# Patient Record
Sex: Male | Born: 1975 | Race: White | Hispanic: No | Marital: Married | Smoking: Current some day smoker
Health system: Southern US, Community
[De-identification: ages and names within clinical notes are randomized; demographics above are authoritative.]

## PROBLEM LIST (undated history)

## (undated) DIAGNOSIS — J189 Pneumonia, unspecified organism: Secondary | ICD-10-CM

## (undated) HISTORY — PX: BACK SURGERY: SHX140

---

## 2002-12-04 ENCOUNTER — Emergency Department (HOSPITAL_COMMUNITY): Admission: EM | Admit: 2002-12-04 | Discharge: 2002-12-05 | Payer: Self-pay | Admitting: *Deleted

## 2002-12-05 ENCOUNTER — Encounter: Payer: Self-pay | Admitting: *Deleted

## 2010-02-14 ENCOUNTER — Emergency Department (HOSPITAL_BASED_OUTPATIENT_CLINIC_OR_DEPARTMENT_OTHER): Admission: EM | Admit: 2010-02-14 | Discharge: 2010-02-14 | Payer: Self-pay | Admitting: Emergency Medicine

## 2010-07-01 ENCOUNTER — Ambulatory Visit: Payer: Self-pay | Admitting: Interventional Radiology

## 2010-07-14 ENCOUNTER — Encounter: Admission: RE | Admit: 2010-07-14 | Discharge: 2010-10-12 | Payer: Self-pay | Admitting: Orthopedic Surgery

## 2010-08-17 ENCOUNTER — Ambulatory Visit (HOSPITAL_COMMUNITY): Admission: RE | Admit: 2010-08-17 | Discharge: 2010-08-17 | Payer: Self-pay | Admitting: Orthopedic Surgery

## 2010-10-09 ENCOUNTER — Emergency Department (HOSPITAL_BASED_OUTPATIENT_CLINIC_OR_DEPARTMENT_OTHER)
Admission: EM | Admit: 2010-10-09 | Discharge: 2010-10-09 | Payer: Self-pay | Source: Home / Self Care | Admitting: Emergency Medicine

## 2010-10-09 ENCOUNTER — Ambulatory Visit: Payer: Self-pay | Admitting: Diagnostic Radiology

## 2010-10-29 ENCOUNTER — Emergency Department (HOSPITAL_BASED_OUTPATIENT_CLINIC_OR_DEPARTMENT_OTHER): Admission: EM | Admit: 2010-10-29 | Discharge: 2010-07-01 | Payer: Self-pay | Admitting: Emergency Medicine

## 2010-11-18 ENCOUNTER — Observation Stay (HOSPITAL_COMMUNITY)
Admission: RE | Admit: 2010-11-18 | Discharge: 2010-11-19 | Payer: Self-pay | Source: Home / Self Care | Attending: Orthopedic Surgery | Admitting: Orthopedic Surgery

## 2011-01-05 ENCOUNTER — Ambulatory Visit: Payer: BC Managed Care – PPO | Attending: Orthopedic Surgery | Admitting: Physical Therapy

## 2011-01-05 DIAGNOSIS — M542 Cervicalgia: Secondary | ICD-10-CM | POA: Insufficient documentation

## 2011-01-05 DIAGNOSIS — R5381 Other malaise: Secondary | ICD-10-CM | POA: Insufficient documentation

## 2011-01-05 DIAGNOSIS — IMO0001 Reserved for inherently not codable concepts without codable children: Secondary | ICD-10-CM | POA: Insufficient documentation

## 2011-01-05 DIAGNOSIS — R293 Abnormal posture: Secondary | ICD-10-CM | POA: Insufficient documentation

## 2011-01-11 ENCOUNTER — Ambulatory Visit: Payer: BC Managed Care – PPO | Admitting: Physical Therapy

## 2011-01-13 ENCOUNTER — Ambulatory Visit: Payer: BC Managed Care – PPO | Admitting: Physical Therapy

## 2011-01-15 ENCOUNTER — Ambulatory Visit: Payer: BC Managed Care – PPO | Admitting: Physical Therapy

## 2011-01-19 ENCOUNTER — Ambulatory Visit: Payer: BC Managed Care – PPO | Admitting: Physical Therapy

## 2011-01-21 ENCOUNTER — Ambulatory Visit: Payer: BC Managed Care – PPO | Attending: Orthopedic Surgery | Admitting: Physical Therapy

## 2011-01-21 DIAGNOSIS — R293 Abnormal posture: Secondary | ICD-10-CM | POA: Insufficient documentation

## 2011-01-21 DIAGNOSIS — IMO0001 Reserved for inherently not codable concepts without codable children: Secondary | ICD-10-CM | POA: Insufficient documentation

## 2011-01-21 DIAGNOSIS — M542 Cervicalgia: Secondary | ICD-10-CM | POA: Insufficient documentation

## 2011-01-21 DIAGNOSIS — R5381 Other malaise: Secondary | ICD-10-CM | POA: Insufficient documentation

## 2011-01-26 ENCOUNTER — Ambulatory Visit: Payer: BC Managed Care – PPO | Admitting: Physical Therapy

## 2011-01-28 ENCOUNTER — Ambulatory Visit: Payer: BC Managed Care – PPO | Admitting: Physical Therapy

## 2011-02-01 LAB — CBC
Hemoglobin: 14.8 g/dL (ref 13.0–17.0)
RBC: 4.95 MIL/uL (ref 4.22–5.81)

## 2011-02-01 LAB — SURGICAL PCR SCREEN: MRSA, PCR: NEGATIVE

## 2011-02-04 ENCOUNTER — Ambulatory Visit: Payer: BC Managed Care – PPO | Admitting: Physical Therapy

## 2011-06-25 ENCOUNTER — Encounter: Payer: Self-pay | Admitting: *Deleted

## 2011-06-25 ENCOUNTER — Emergency Department (INDEPENDENT_AMBULATORY_CARE_PROVIDER_SITE_OTHER): Payer: BC Managed Care – PPO

## 2011-06-25 ENCOUNTER — Emergency Department (HOSPITAL_BASED_OUTPATIENT_CLINIC_OR_DEPARTMENT_OTHER): Payer: BC Managed Care – PPO

## 2011-06-25 ENCOUNTER — Emergency Department (HOSPITAL_BASED_OUTPATIENT_CLINIC_OR_DEPARTMENT_OTHER)
Admission: EM | Admit: 2011-06-25 | Discharge: 2011-06-25 | Disposition: A | Discharge status: Home / Self Care | Payer: BC Managed Care – PPO | Attending: Emergency Medicine | Admitting: Emergency Medicine

## 2011-06-25 DIAGNOSIS — R059 Cough, unspecified: Secondary | ICD-10-CM | POA: Insufficient documentation

## 2011-06-25 DIAGNOSIS — J189 Pneumonia, unspecified organism: Secondary | ICD-10-CM | POA: Insufficient documentation

## 2011-06-25 DIAGNOSIS — R05 Cough: Secondary | ICD-10-CM | POA: Insufficient documentation

## 2011-06-25 MED ORDER — AZITHROMYCIN 250 MG PO TABS: 250.0000 mg | ORAL_TABLET | Freq: Every day | ORAL | Status: AC

## 2011-06-25 MED ORDER — LIDOCAINE HCL (PF) 1 % IJ SOLN
INTRAMUSCULAR | Status: AC
  Administered 2011-06-25: 1.7 mL
  Filled 2011-06-25: qty 5

## 2011-06-25 MED ORDER — DEXTROSE 5 % IV SOLN
INTRAVENOUS | Status: AC
  Filled 2011-06-25: qty 1

## 2011-06-25 MED ORDER — AZITHROMYCIN 250 MG PO TABS
500.0000 mg | ORAL_TABLET | Freq: Once | ORAL | Status: AC
  Administered 2011-06-25: 500 mg via ORAL
  Filled 2011-06-25: qty 2

## 2011-06-25 MED ORDER — CEFTRIAXONE SODIUM 1 G IJ SOLR
1.0000 g | Freq: Once | INTRAMUSCULAR | Status: AC
  Administered 2011-06-25: 1 g via INTRAMUSCULAR
  Filled 2011-06-25: qty 1

## 2011-06-25 NOTE — ED Notes (Signed)
Cough, fever, aching all over, and headache x 1 week.  

## 2011-06-25 NOTE — ED Provider Notes (Signed)
History     CSN: [id number] Arrival date & time: 06/25/2011  1:52 PM  Chief Complaint  Patient presents with  . URI   Patient is a 35 y.o. male presenting with URI. The history is provided by the patient.  URI The primary symptoms include fever and cough. The current episode started more than 1 week ago. This is a new problem. The problem has been gradually worsening.  Symptoms associated with the illness include chills, congestion and rhinorrhea. The following treatments were addressed: Acetaminophen was not tried. A decongestant was not tried. Aspirin was not tried. NSAIDs were not tried.    History reviewed. No pertinent past medical history.  Past Surgical History  Procedure Date  . Back surgery     No family history on file.  History  Substance Use Topics  . Smoking status: Not on file  . Smokeless tobacco: Current User  . Alcohol Use: Yes     occasionally      Review of Systems  Constitutional: Positive for fever and chills.  HENT: Positive for congestion and rhinorrhea.   Respiratory: Positive for cough.   All other systems reviewed and are negative.    Physical Exam  BP 119/74  Pulse 88  Temp(Src) 99.8 F (37.7 C) (Oral)  Resp 22  SpO2 97%  Physical Exam  Constitutional: He is oriented to person, place, and time. He appears well-developed and well-nourished.  HENT:  Head: Normocephalic and atraumatic.  Eyes: Conjunctivae and EOM are normal. Pupils are equal, round, and reactive to light.  Neck: Normal range of motion. Neck supple.  Cardiovascular: Normal rate.   Pulmonary/Chest: Effort normal and breath sounds normal.  Abdominal: Soft.  Musculoskeletal: Normal range of motion.  Neurological: He is alert and oriented to person, place, and time. He has normal reflexes.  Skin: Skin is warm and dry.  Psychiatric: He has a normal mood and affect.    ED Course  Procedures  MDM Chest xray shows right middle lobe pneumonia,  Pt counseled on need for  recheck chest xray in 4 weeks.  Results for orders placed during the hospital encounter of 11/18/10  CBC      Component Value Range   WBC 6.5  4.0 - 10.5 (K/uL)   RBC 4.95  4.22 - 5.81 (MIL/uL)   Hemoglobin 14.8  13.0 - 17.0 (g/dL)   HCT 78.2  95.6 - 21.3 (%)   MCV 89.1  78.0 - 100.0 (fL)   MCH 29.9  26.0 - 34.0 (pg)   MCHC 33.6  30.0 - 36.0 (g/dL)   RDW 08.6  57.8 - 46.9 (%)   Platelets 303  150 - 400 (K/uL)  SURGICAL PCR SCREEN      Component Value Range   MRSA, PCR NEGATIVE  NEGATIVE    Staphylococcus aureus   (*) NEGATIVE    Value: POSITIVE            The Xpert SA Assay (FDA     approved for NASAL specimens     only), is one component of     a comprehensive surveillance     program.  It is not intended     to diagnose infection nor to     guide or monitor treatment.   Dg Chest 2 View  06/25/2011  *RADIOLOGY REPORT*  Clinical Data: Cough, fever  CHEST - 2 VIEW  Comparison: 10/09/2010  Findings: Unchanged cardiac silhouette and mediastinal contours. Interval development of a heterogeneous air space opacity within the  right middle lobe worrisome for pneumonia.  No pleural effusion or pneumothorax.  Interval ACDF of the lower cervical spine, incompletely evaluated.  IMPRESSION: Right middle lobe pneumonia.  A follow-up chest radiograph in 4-6 weeks after treatment is recommended to ensure resolution.  Original Report Authenticated By: Waynard Reeds, M.D.       Langston Masker, [place] 06/25/11 [phone]

## 2011-06-28 LAB — ROCKY MTN SPOTTED FVR AB, IGG-BLOOD: RMSF IgG: 0.16 IV

## 2011-07-07 NOTE — ED Provider Notes (Signed)
History/physical exam/procedure(s) were performed by non-physician practitioner and as supervising physician I was immediately available for consultation/collaboration. I have reviewed all notes and am in agreement with care and plan.   Hilario Quarry, MD 07/07/11 [phone]

## 2013-11-12 ENCOUNTER — Encounter (HOSPITAL_BASED_OUTPATIENT_CLINIC_OR_DEPARTMENT_OTHER): Payer: Self-pay | Admitting: Emergency Medicine

## 2013-11-12 ENCOUNTER — Emergency Department (HOSPITAL_BASED_OUTPATIENT_CLINIC_OR_DEPARTMENT_OTHER)
Admission: EM | Admit: 2013-11-12 | Discharge: 2013-11-12 | Disposition: A | Discharge status: Home / Self Care | Payer: 59 | Attending: Emergency Medicine | Admitting: Emergency Medicine

## 2013-11-12 ENCOUNTER — Emergency Department (HOSPITAL_BASED_OUTPATIENT_CLINIC_OR_DEPARTMENT_OTHER): Payer: 59

## 2013-11-12 DIAGNOSIS — R630 Anorexia: Secondary | ICD-10-CM | POA: Insufficient documentation

## 2013-11-12 DIAGNOSIS — J111 Influenza due to unidentified influenza virus with other respiratory manifestations: Secondary | ICD-10-CM | POA: Insufficient documentation

## 2013-11-12 MED ORDER — AZITHROMYCIN 250 MG PO TABS: ORAL_TABLET | ORAL | Status: DC

## 2013-11-12 MED ORDER — HYDROCOD POLST-CHLORPHEN POLST 10-8 MG/5ML PO LQCR
5.0000 mL | Freq: Once | ORAL | Status: AC
  Administered 2013-11-12: 5 mL via ORAL
  Filled 2013-11-12: qty 5

## 2013-11-12 MED ORDER — ACETAMINOPHEN 500 MG PO TABS
1000.0000 mg | ORAL_TABLET | Freq: Once | ORAL | Status: AC
  Administered 2013-11-12: 1000 mg via ORAL
  Filled 2013-11-12: qty 2

## 2013-11-12 MED ORDER — HYDROCOD POLST-CHLORPHEN POLST 10-8 MG/5ML PO LQCR: 5.0000 mL | Freq: Two times a day (BID) | ORAL | Status: DC | PRN

## 2013-11-12 NOTE — ED Notes (Signed)
Flu like symptoms since Friday. Pt took theraflu last night that had fever reducer in it but has not had anything this am but delysm for cough.

## 2013-11-12 NOTE — ED Provider Notes (Addendum)
CSN: [id number]     Arrival date & time 11/12/13  0518 History   First MD Initiated Contact with Patient 11/12/13 0543     Chief Complaint  Patient presents with  . Fever   (Consider location/radiation/quality/duration/timing/severity/associated sxs/prior Treatment) HPI This is a 37 year old male with a three-day history of flulike symptoms. Specifically he has had fever, body aches, cough, malaise and decreased appetite. The cough is nonproductive. This morning his fever peaked at 103.4. He has not taken in antipyretic this morning, but did take Delsym. Yesterday he developed some nasal congestion. He has had no nausea, vomiting or diarrhea. He did not get a flu vaccine this year. He has had pneumonia in the past.  History reviewed. No pertinent past medical history. Past Surgical History  Procedure Laterality Date  . Back surgery     No family history on file. History  Substance Use Topics  . Smoking status: Never Smoker   . Smokeless tobacco: Current User  . Alcohol Use: Yes     Comment: occasionally    Review of Systems  All other systems reviewed and are negative.    Allergies  Review of patient's allergies indicates no known allergies.  Home Medications   Current Outpatient Rx  Name  Route  Sig  Dispense  Refill  . pseudoephedrine-acetaminophen (TYLENOL SINUS) 30-500 MG TABS   Oral   Take 1 tablet by mouth every 4 (four) hours as needed.          BP 123/68  Pulse 104  Temp(Src) 100.5 F (38.1 C)  Resp 20  Ht 5\' 7"  (1.702 m)  Wt 185 lb (83.915 kg)  BMI 28.97 kg/m2  SpO2 98%  Physical Exam General: Well-developed, well-nourished male in no acute distress; appearance consistent with age of record HENT: normocephalic; atraumatic; nasal congestion; pharyngeal erythema without exudate topically Eyes: pupils equal, round and reactive to light; extraocular muscles intact Neck: supple;  Heart: regular rate and rhythm;  tachycardia  Lungs: clear to  auscultation bilaterally; frequent dry cough  Abdomen: soft; nondistended; nontender; no masses or hepatosplenomegaly; bowel sounds present Extremities: No deformity; full range of motion; pulses normal Neurologic: Awake, alert and oriented; motor function intact in all extremities and symmetric; no facial droop Skin: Warm and dry Psychiatric:  flat affect    ED Course  Procedures (including critical care time)  MDM  Nursing notes and vitals signs, including pulse oximetry, reviewed.  Summary of this visit's results, reviewed by myself:  Imaging Studies: Dg Chest 2 View  11/12/2013   CLINICAL DATA:  Fever, cough, congestion and body aches.  EXAM: CHEST  2 VIEW  COMPARISON:  Chest radiograph performed 06/25/2011  FINDINGS: The lungs are well-aerated. Mild right basilar airspace opacity could reflect atelectasis or mild pneumonia. There is no evidence of pleural effusion or pneumothorax.  The heart is borderline normal in size; the mediastinal contour is within normal limits. No acute osseous abnormalities are seen. Cervical spinal fusion hardware is partially imaged.  IMPRESSION: Mild right basilar airspace opacity could reflect atelectasis or mild pneumonia.   Electronically Signed   By: Roanna Raider M.D.   On: 11/12/2013 06:42   The patient's personal illnesses likely influenza but we will treat with any ionic given suggestive x-ray findings and history of pneumonia in the past.      Hanley Seamen, MD 11/12/13 [id number]  Hanley Seamen, MD 11/12/13 [phone]

## 2013-11-12 NOTE — ED Notes (Signed)
Transported to xray 

## 2013-11-12 NOTE — ED Notes (Signed)
Returned from xray

## 2014-11-29 ENCOUNTER — Encounter: Payer: Self-pay | Admitting: Emergency Medicine

## 2014-11-29 ENCOUNTER — Emergency Department (INDEPENDENT_AMBULATORY_CARE_PROVIDER_SITE_OTHER)
Admission: EM | Admit: 2014-11-29 | Discharge: 2014-11-29 | Disposition: A | Discharge status: Home / Self Care | Payer: 59 | Source: Home / Self Care | Attending: Family Medicine | Admitting: Family Medicine

## 2014-11-29 ENCOUNTER — Emergency Department (INDEPENDENT_AMBULATORY_CARE_PROVIDER_SITE_OTHER): Payer: 59

## 2014-11-29 DIAGNOSIS — R062 Wheezing: Secondary | ICD-10-CM

## 2014-11-29 DIAGNOSIS — J209 Acute bronchitis, unspecified: Secondary | ICD-10-CM

## 2014-11-29 DIAGNOSIS — Z8701 Personal history of pneumonia (recurrent): Secondary | ICD-10-CM

## 2014-11-29 DIAGNOSIS — R05 Cough: Secondary | ICD-10-CM

## 2014-11-29 HISTORY — DX: Pneumonia, unspecified organism: J18.9

## 2014-11-29 MED ORDER — PREDNISONE 20 MG PO TABS: 20.0000 mg | ORAL_TABLET | Freq: Two times a day (BID) | ORAL | Status: DC

## 2014-11-29 MED ORDER — AZITHROMYCIN 250 MG PO TABS: ORAL_TABLET | ORAL | Status: DC

## 2014-11-29 MED ORDER — BENZONATATE 200 MG PO CAPS: 200.0000 mg | ORAL_CAPSULE | Freq: Every day | ORAL | Status: DC

## 2014-11-29 NOTE — ED Notes (Signed)
Dry cough x 1 week, hx pneumonia 4 x in 4 yrs

## 2014-11-29 NOTE — Discharge Instructions (Signed)
Take plain Mucinex (1200 mg guaifenesin) twice daily for cough and congestion.  May add Pseudoephedrine if sinus congestion develops.   Increase fluid intake, rest. May use Afrin nasal spray (or generic oxymetazoline) twice daily for about 5 days.  Also recommend using saline nasal spray several times daily and saline nasal irrigation (AYR is a common brand) Try warm salt water gargles for sore throat.  Stop all antihistamines for now, and other non-prescription cough/cold preparations.  Follow-up with family doctor if not improving about one week

## 2014-11-29 NOTE — ED Provider Notes (Signed)
CSN: [id number]     Arrival date & time 11/29/14  1906 History   First MD Initiated Contact with Patient 11/29/14 1943     Chief Complaint  Patient presents with  . Cough      HPI Comments: Patient complains of onset of chest congestion, non-productive cough, and wheezing without nasal congestion or sore throat about four days ago.  He feels tightness in his anterior chest and has shortness of breath with activity.  No fevers, chills, and sweats. He had an episode of pneumonia September 2015  The history is provided by the patient and the spouse.    Past Medical History  Diagnosis Date  . Pneumonia    Past Surgical History  Procedure Laterality Date  . Back surgery     No family history on file. History  Substance Use Topics  . Smoking status: Never Smoker   . Smokeless tobacco: Current User  . Alcohol Use: Yes     Comment: occasionally    Review of Systems + sore throat + cough No pleuritic pain but has aching in ribs bilaterally + wheezing No nasal congestion No post-nasal drainage No sinus pain/pressure No itchy/red eyes No earache No hemoptysis + SOB with activity No fever, + chills No nausea No vomiting No abdominal pain + diarrhea, resolved No urinary symptoms No skin rash + fatigue No myalgias + headache Used OTC meds without relief  Allergies  Review of patient's allergies indicates not on file.  Home Medications   Prior to Admission medications   Medication Sig Start Date End Date Taking? Authorizing Provider  ibuprofen (ADVIL,MOTRIN) 200 MG tablet Take 200 mg by mouth every 6 (six) hours as needed.   Yes Historical Provider, MD  azithromycin (ZITHROMAX Z-PAK) 250 MG tablet 2 po day one, then 1 daily x 4 days 11/29/14   Lattie Haw, MD  benzonatate (TESSALON) 200 MG capsule Take 1 capsule (200 mg total) by mouth at bedtime. Take as needed for cough 11/29/14   Lattie Haw, MD  predniSONE (DELTASONE) 20 MG tablet Take 1 tablet (20 mg total) by  mouth 2 (two) times daily. Take with food. 11/29/14   Lattie Haw, MD  pseudoephedrine-acetaminophen (TYLENOL SINUS) 30-500 MG TABS Take 1 tablet by mouth every 4 (four) hours as needed.    Historical Provider, MD   BP 150/91 mmHg  Pulse 82  Temp(Src) 98.7 F (37.1 C) (Oral)  Resp 16  Ht  (1.702 m)  Wt 180 lb (81.647 kg)  BMI 28.19 kg/m2  SpO2 96% Physical Exam Nursing notes and Vital Signs reviewed. Appearance:  Patient appears healthy, stated age, and in no acute distress Eyes:  Pupils are equal, round, and reactive to light and accomodation.  Extraocular movement is intact.  Conjunctivae are not inflamed  Ears:  Canals normal.  Tympanic membranes normal.  Nose:  Mildly congested turbinates.  No sinus tenderness.    Pharynx:  Normal Neck:  Supple.  Posterior nodes are prominent but nontender bilaterally  Lungs:  Clear to auscultation.  Breath sounds are equal.  Heart:  Regular rate and rhythm without murmurs, rubs, or gallops.  Abdomen:  Nontender without masses or hepatosplenomegaly.  Bowel sounds are present.  No CVA or flank tenderness.  Extremities:  No edema.  No calf tenderness Skin:  No rash present.   ED Course  Procedures  none    Imaging Review Dg Chest 2 View  11/29/2014   CLINICAL DATA:  One week history of  wheezing and cough  EXAM: CHEST  2 VIEW  COMPARISON:  January 25, 2014  FINDINGS: There is no edema or consolidation. The heart size and pulmonary vascularity are within normal limits. No adenopathy. There is postoperative change in the lower cervical spine.  IMPRESSION: No edema or consolidation.   Electronically Signed   By: Bretta BangWilliam  Woodruff M.D.   On: 11/29/2014 19:49     MDM   1. Acute bronchitis, unspecified organism   2. History of pneumonia    Begin Z-pack to cover atypicals, and prednisone burst.  Prescription written for Benzonatate (Tessalon) to take at bedtime for night-time cough.  Take plain Mucinex (1200 mg guaifenesin) twice daily for cough  and congestion.  May add Pseudoephedrine if sinus congestion develops.   Increase fluid intake, rest. May use Afrin nasal spray (or generic oxymetazoline) twice daily for about 5 days.  Also recommend using saline nasal spray several times daily and saline nasal irrigation (AYR is a common brand) Try warm salt water gargles for sore throat.  Stop all antihistamines for now, and other non-prescription cough/cold preparations.  Follow-up with family doctor if not improving about one week    Lattie HawStephen A Beese, MD 12/01/14 [phone]

## 2015-11-23 HISTORY — PX: OTHER SURGICAL HISTORY: SHX169

## 2016-06-11 IMAGING — CR DG CHEST 2V
2 series · 2 of 2 positions shown · non-contrast
Comparison: January 25, 2014

CLINICAL DATA: One week history of wheezing and cough

EXAM:
CHEST  2 VIEW

[view not recorded (1 of 2)]
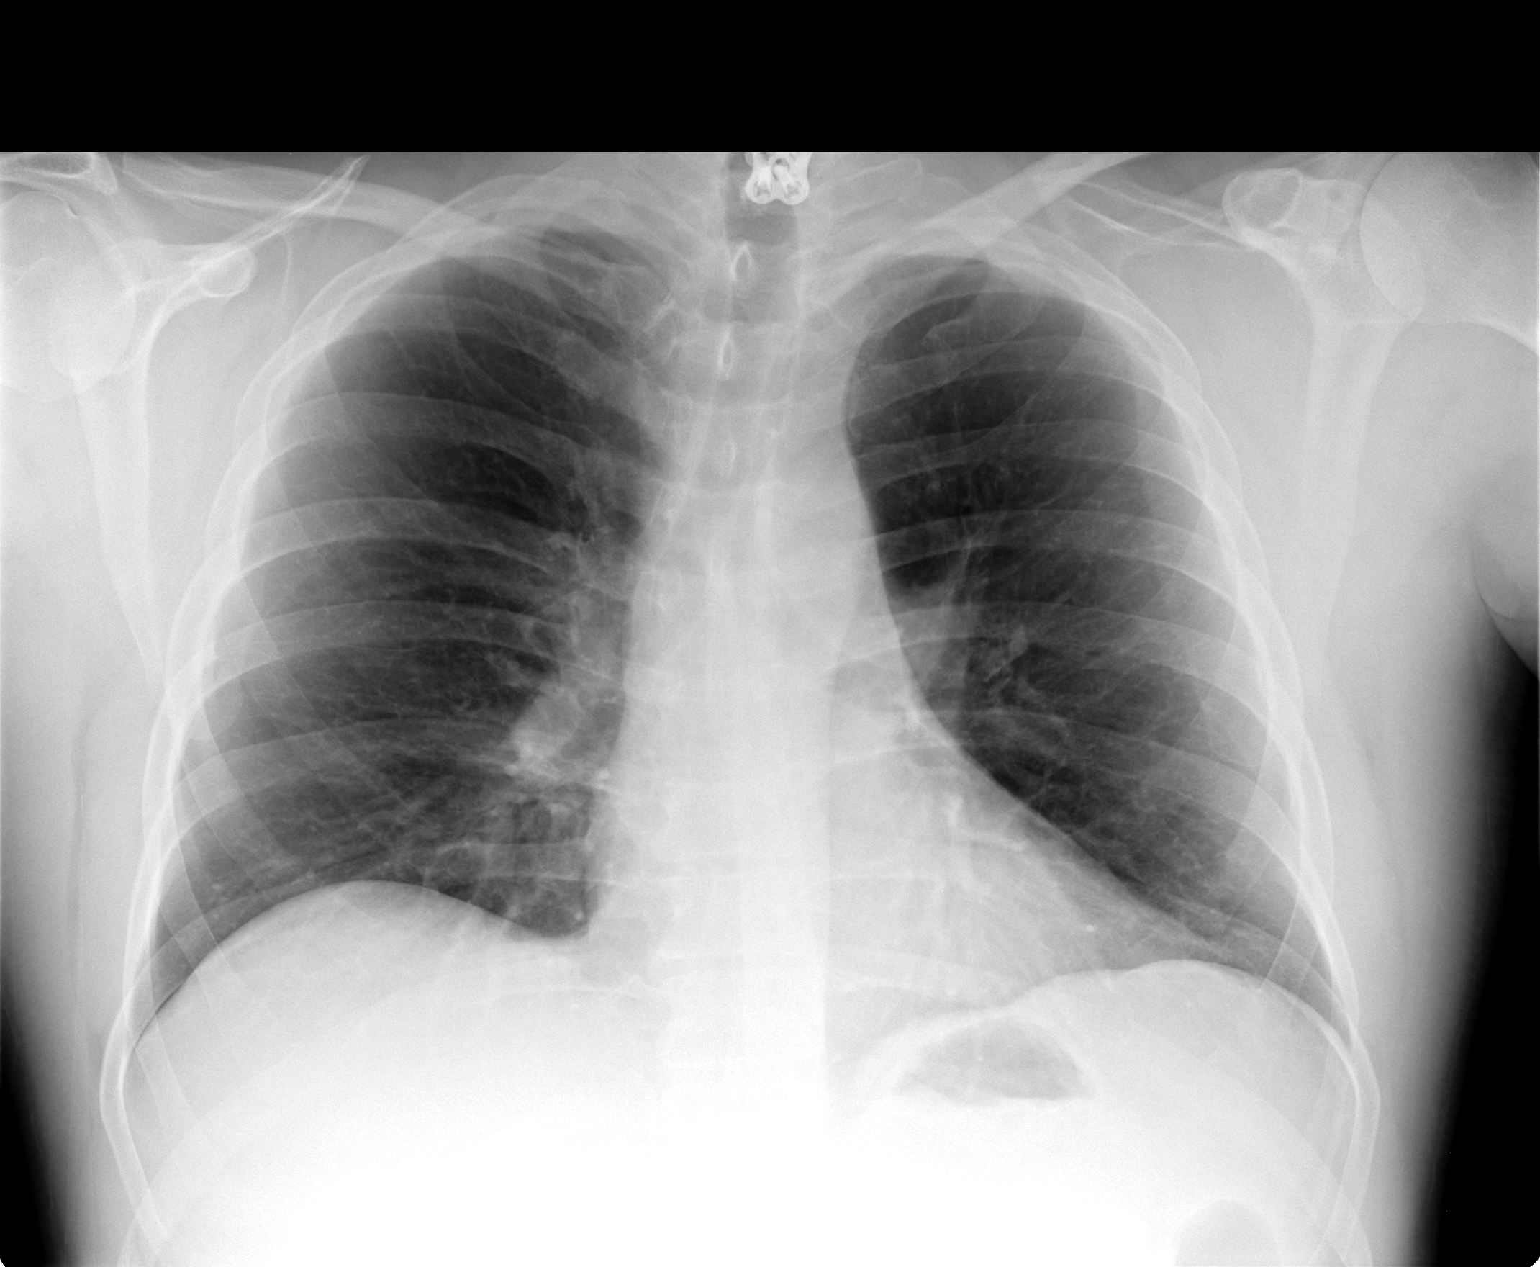

[view not recorded (2 of 2)]
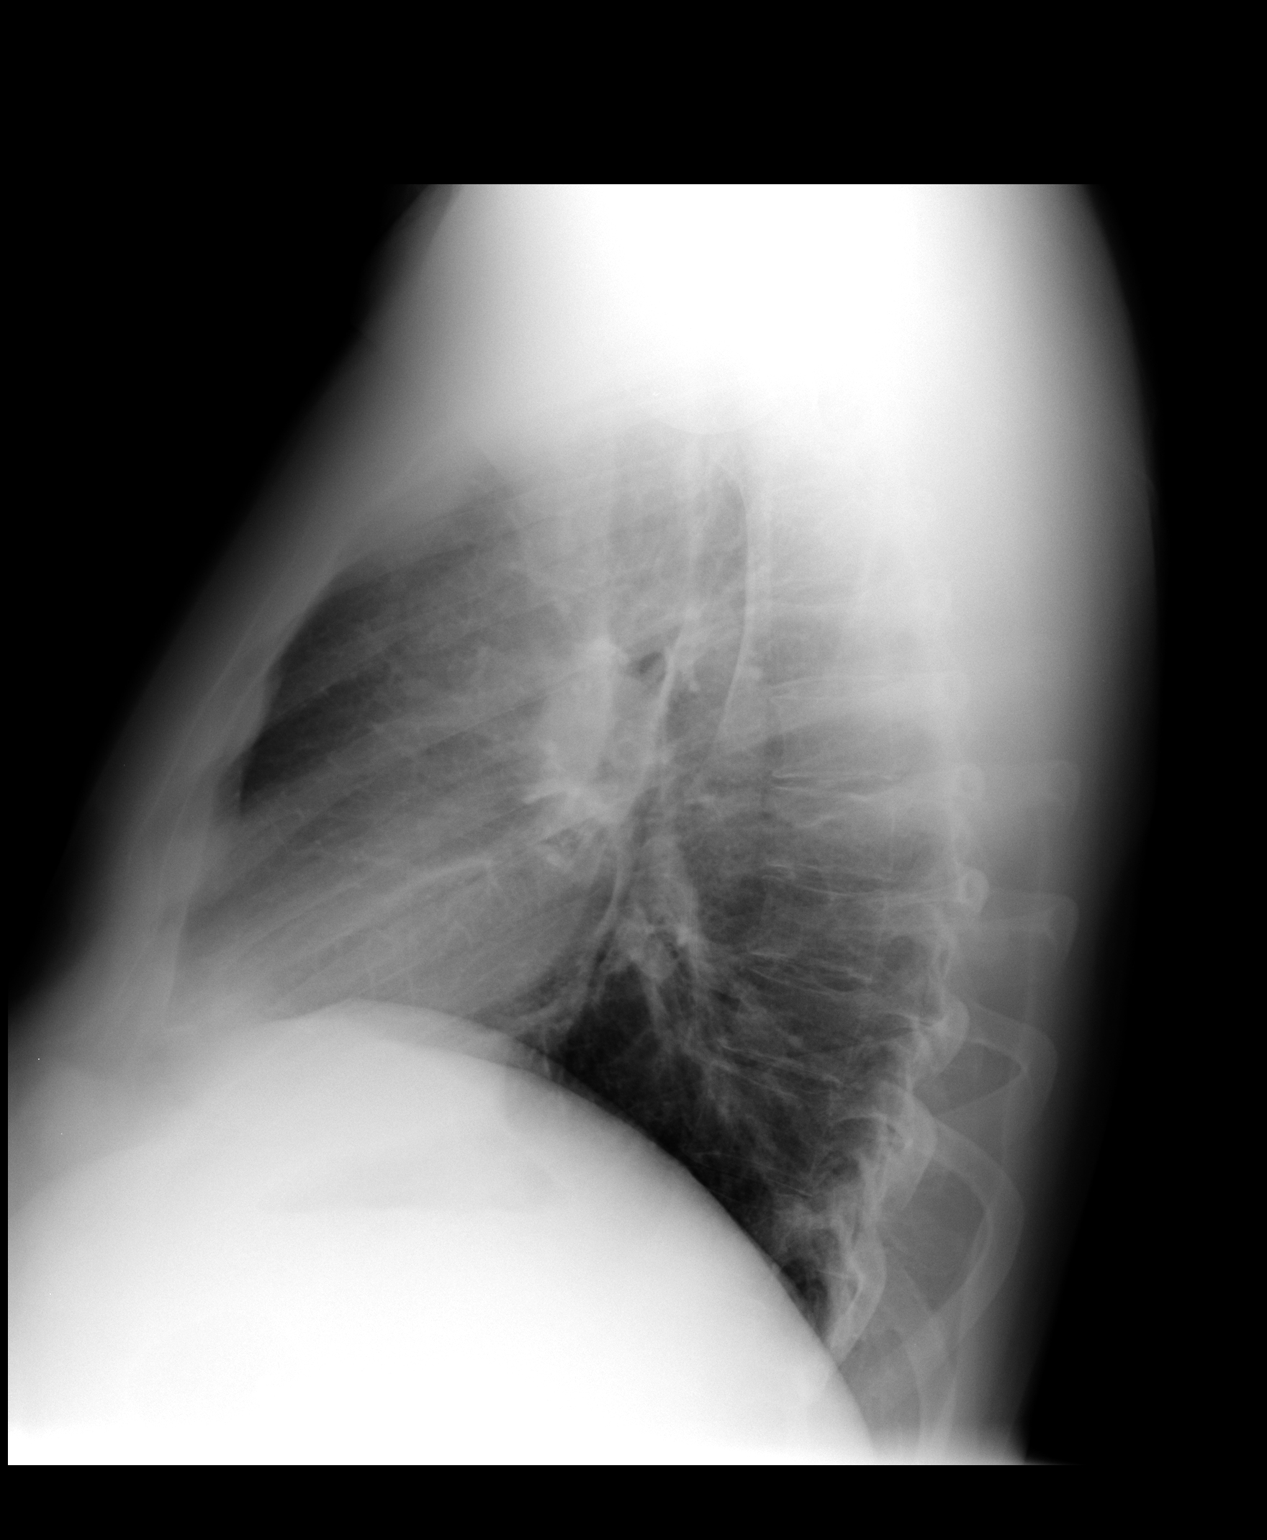

[2 of 2 positions shown; findings below may reference images not displayed]

FINDINGS: There is no edema or consolidation. The heart size and pulmonary
vascularity are within normal limits. No adenopathy. There is
postoperative change in the lower cervical spine.
IMPRESSION: No edema or consolidation.

## 2020-01-23 ENCOUNTER — Ambulatory Visit: Payer: 59 | Admitting: Neurology

## 2020-01-23 ENCOUNTER — Other Ambulatory Visit: Payer: Self-pay

## 2020-01-23 ENCOUNTER — Encounter: Payer: Self-pay | Admitting: Neurology

## 2020-01-23 VITALS — BP 136/84 | HR 59 | Temp 98.6°F | Ht 67.0 in | Wt 201.0 lb

## 2020-01-23 DIAGNOSIS — R202 Paresthesia of skin: Secondary | ICD-10-CM

## 2020-01-23 DIAGNOSIS — Z981 Arthrodesis status: Secondary | ICD-10-CM

## 2020-01-23 NOTE — Patient Instructions (Signed)
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 This procedure includes the combined use of electromyogram (EMG) and nerve conduction study (NCS). EMG is used to look for muscular disorders. NCS, which is also called electroneurogram, measures how well your nerves are controlling your muscles. The procedures are usually done together to check if your muscles and nerves are healthy. If the results of the tests are abnormal, this may indicate disease or injury, such as a neuromuscular disease or peripheral nerve damage. Tell a health care provider about:  Any allergies you have.  All medicines you are taking, including vitamins, herbs, eye drops, creams, and over-the-counter medicines.  Any problems you or family members have had with anesthetic medicines.  Any blood disorders you have.  Any surgeries you have had.  Any medical conditions you have.  If you have a pacemaker.  Whether you are pregnant or may be pregnant. What are the risks? Generally, this is a safe procedure. However, problems may occur, including:  Infection where the electrodes were inserted.  Bleeding. What happens before the procedure? Medicines Ask your health care provider about:  Changing or stopping your regular medicines. This is especially important if you are taking diabetes medicines or blood thinners.  Taking medicines such as aspirin and ibuprofen. These medicines can thin your blood. Do not take these medicines unless your health care provider tells you to take them.  Taking over-the-counter medicines, vitamins, herbs, and supplements. General instructions  Your health care provider may ask you to avoid: ? Beverages that have caffeine, such as coffee and tea. ? Any products that contain nicotine or tobacco. These products include cigarettes, e-cigarettes,  and chewing tobacco. If you need help quitting, ask your health care provider.  Do not use lotions or creams on the same day that you will be having the procedure. What happens during the procedure? For EMG   Your health care provider will ask you to stay in a position so that he or she can access the muscle that will be studied. You may be standing, sitting, or lying down.  You may be given a medicine that numbs the area (local anesthetic).  A very thin needle that has an electrode will be inserted into your muscle.  Another small electrode will be placed on your skin near the muscle.  Your health care provider will ask you to continue to remain still.  The electrodes will send a signal that tells about the electrical activity of your muscles. You may see this on a monitor or hear it in the room.  After your muscles have been studied at rest, your health care provider will ask you to contract or flex your muscles. The electrodes will send a signal that tells about the electrical activity of your muscles.  Your health care provider will remove the electrodes and the electrode needles when the procedure is finished. The procedure may vary among health care providers and hospitals. For NCS   An electrode that records your nerve activity (recording electrode) will be placed on your skin by the muscle that is being studied.  An electrode that is used as a reference (reference electrode) will be placed near the recording electrode.  A paste or gel will be applied to your skin between the recording electrode and the reference electrode.  Your nerve will be stimulated with a mild shock. Your health care provider will  measure how much time it takes for your muscle to react.  Your health care provider will remove the electrodes and the gel when the procedure is finished. The procedure may vary among health care providers and hospitals. What happens after the procedure?  It is up to you  to get the results of your procedure. Ask your health care provider, or the department that is doing the procedure, when your results will be ready.  Your health care provider may: ? Give you medicines for any pain. ? Monitor the insertion sites to make sure that bleeding stops. Summary  Electromyoneurogram is a test to check how well your muscles and nerves are working.  If the results of the tests are abnormal, this may indicate disease or injury.  This is a safe procedure. However, problems may occur, such as bleeding and infection.  Your health care provider will do two tests to complete this procedure. One checks your muscles (EMG) and another checks your nerves (NCS).  It is up to you to get the results of your procedure. Ask your health care provider, or the department that is doing the procedure, when your results will be ready. This information is not intended to replace advice given to you by your health care provider. Make sure you discuss any questions you have with your health care provider. Document Revised: 07/25/2018 Document Reviewed: 07/07/2018 Elsevier Patient Education  2020 [employer].

## 2020-01-23 NOTE — Progress Notes (Signed)
[facility]    Provider:  Dr Lucia Gaskins Requesting Provider: Venita Lick, MD Primary Care Provider:  Medicine, [facility]  CC:  Numbness and tingling in the hands  HPI:  Ernest Avery is a 44 y.o. male here as requested by Venita Lick, MD for paresthesias, numbness and tingling in the hands and feet. He reports numbness in the hands. He has had mri cervical spine and acdf. Dr Shon Baton does not think this is from his neck. Comes and goes. Both hands. Weak grip. Dropping objects. At night he wakes up and shakes the hands out, so numb like dead fish. All the fingers. Numb and tingly. And not related to neck pain or shooting pain from the neck. He works for himself, he is working a lot with his hands. He smokes some. Symptoms started worsening in the hands 5-6 months ago, can be painful when he is waking it is moderately painful. Emg/ncs in the uppers.   Leg symptoms are positional. When he is sitting for long periods of time, resolves with standing and moving, both feet but not all the time. Sitting for long periods, he feels it in his back starts int he lower back with tingling. Also cramps in the calfs. Symptoms started in the low back and feet maybe around the same time. Will check a few sensory and motors in one leg right leg.No significant low back stiffness.   Reviewed notes, labs and imaging from outside physicians, which showed:  Reviewed CT of the head report from 2004: CLINICAL DATA:  HEADACHE WITH BLURRY VISION. HEAD CT WITHOUT CONTRAST COMPARISON NONE.  CONTIGUOUS AXIAL IMAGES OF THE BRAIN WERE OBTAINED AFTER ROUTINE UNINFUSED CT SCANNING OF THE HEAD. THERE IS NO EVIDENCE OF INTRACRANIAL HEMORRHAGE, BRAIN EDEMA, OR MASS EFFECT.  THE VENTRICLES ARE NORMAL.  NO EXTRA-AXIAL ABNORMALITIES ARE IDENTIFIED.  BONE WINDOWS SHOW NO SIGNIFICANT ABNORMALITIES. IMPRESSION NEGATIVE NON-CONTRAST HEAD CT.  Cmp 12/06/2019 nml, cbc unremarkable 05/2017   Reviewed dg cervical spine 2011 and agree with following report:  Findings: ACDF of C6-7.  Anterior plate and interbody metal spacer are in good position although bony detail is limited due to overlying shoulders.  The remainder of the cervical spine appears normal.   IMPRESSION: ACDF C6-7.  Review of Systems: Patient complains of symptoms per HPI as well as the following symptoms: chronic hand numbness from surgery, low back pain. Pertinent negatives and positives per HPI. All others negative.   Social History   Socioeconomic History  . Marital status: Married    Spouse name: Not on file  . Number of children: 3  . Years of education: HS & trade school  . Highest education level: Not on file  Occupational History  . Not on file  Tobacco Use  . Smoking status: Current Some Day Smoker    Types: Cigars  . Smokeless tobacco: Current User    Types: Chew  . Tobacco comment: cigar once in awhile, smoked cigarettes for about a year in his 55s.   Substance and Sexual Activity  . Alcohol use: Yes    Comment: occasionally  . Drug use: No  . Sexual activity: Not on file  Other Topics Concern  . Not on file  Social History Narrative   Lives at home with wife & kids   Right handed   Caffeine: soda/tea. 1-2 sodas a day   Social Determinants of Health   Financial Resource Strain:   . Difficulty of Paying Living Expenses: Not on  file  Food Insecurity:   . Worried About Charity fundraiser in the Last Year: Not on file  . Ran Out of Food in the Last Year: Not on file  Transportation Needs:   . Lack of Transportation (Medical): Not on file  . Lack of Transportation (Non-Medical): Not on file  Physical Activity:   . Days of Exercise per Week: Not on file  . Minutes of Exercise per Session: Not on file  Stress:   . Feeling of Stress : Not on file  Social Connections:   . Frequency of Communication with Friends and Family: Not on file  . Frequency of Social Gatherings with  Friends and Family: Not on file  . Attends Religious Services: Not on file  . Active Member of Clubs or Organizations: Not on file  . Attends Archivist Meetings: Not on file  . Marital Status: Not on file  Intimate Partner Violence:   . Fear of Current or Ex-Partner: Not on file  . Emotionally Abused: Not on file  . Physically Abused: Not on file  . Sexually Abused: Not on file    Family History  Problem Relation Age of Onset  . Prostate cancer Maternal Grandfather   . Heart disease Other        father's side   . Neuropathy Neg Hx     Past Medical History:  Diagnosis Date  . Pneumonia     Patient Active Problem List   Diagnosis Date Noted  . S/P cervical spinal fusion 01/25/2020    Past Surgical History:  Procedure Laterality Date  . BACK SURGERY  2010   C6-C7 spinal fusion   . knee surgery Left 2017    Current Outpatient Medications  Medication Sig Dispense Refill  . hydrOXYzine (ATARAX/VISTARIL) 10 MG tablet Take 10 mg by mouth 3 (three) times daily as needed.    Marland Kitchen ibuprofen (ADVIL,MOTRIN) 200 MG tablet Take 600 mg by mouth in the morning and at bedtime.     . pantoprazole (PROTONIX) 40 MG tablet Take 40 mg by mouth daily.     No current facility-administered medications for this visit.    Allergies as of 01/23/2020  . (Not on File)    Vitals: BP 136/84 (BP Location: Right Arm, Patient Position: Sitting)   Pulse (!) 59   Temp 98.6 F (37 C) Comment: taken at front  Ht 5\' 7"  (1.702 m)   Wt 201 lb (91.2 kg)   BMI 31.48 kg/m  Last Weight:  Wt Readings from Last 1 Encounters:  01/23/20 201 lb (91.2 kg)   Last Height:   Ht Readings from Last 1 Encounters:  01/23/20 5\' 7"  (1.702 m)     Physical exam: Exam: Gen: NAD, conversant, well nourised, obese, well groomed                     CV: RRR, no MRG. No Carotid Bruits. No peripheral edema, warm, nontender Eyes: Conjunctivae clear without exudates or hemorrhage  Neuro: Detailed  Neurologic Exam  Speech:    Speech is normal; fluent and spontaneous with normal comprehension.  Cognition:    The patient is oriented to person, place, and time;     recent and remote memory intact;     language fluent;     normal attention, concentration,     fund of knowledge Cranial Nerves:    The pupils are equal, round, and reactive to light. Attempted fundoscopy, pupils too small. Visual fields are  full to finger confrontation. Extraocular movements are intact. Trigeminal sensation is intact and the muscles of mastication are normal. The face is symmetric. The palate elevates in the midline. Hearing intact. Voice is normal. Shoulder shrug is normal. The tongue has normal motion without fasciculations.   Coordination:    Normal finger to nose  Gait:    Normal native gait  Motor Observation:    No asymmetry, no atrophy, and no involuntary movements noted. Tone:    Normal muscle tone.    Posture:    Posture is normal. normal erect    Strength: right grip weakness.  Strength is V/V in the upper and lower limbs.      Sensation: intact to LT     Reflex Exam:  DTR's:    Deep tendon reflexes in the upper and lower extremities are brisk bilaterally.   Toes:    The toes are downgoing bilaterally.   Clonus:    Clonus is absent.    Assessment/Plan:  Patient with prior acdf with worsening hand numbness and pain likely CTS. He has episodic symptoms in the legs mostly when sitting too long or with positions unlikely polyneuropathy more consistent with lumbar radiculopathy.   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 EMG/NCS.  Lower extremities: not consistent with polyneuropathy likely mild radiculopathy, conservative treatments or return to Dr. Shon Baton for further evaluation.  Orders Placed This Encounter  Procedures  . NCV with EMG(electromyography)     Cc: Venita Lick, MD,  Medicine, [facility]  Naomie Dean, MD  [facility] [address] [place], [place] [address]  Phone [phone] Fax [phone]

## 2020-01-25 ENCOUNTER — Encounter: Payer: Self-pay | Admitting: Neurology

## 2020-01-25 DIAGNOSIS — Z981 Arthrodesis status: Secondary | ICD-10-CM | POA: Insufficient documentation

## 2020-02-28 ENCOUNTER — Encounter: Payer: 59 | Admitting: Neurology

## 2020-03-09 NOTE — Progress Notes (Signed)
Full Name: Ernest Avery Gender: Male MRN #: [id number] Date of Birth: 10-May-1976    Visit Date: 03/10/2020 14:15 Age: 44 Years Examining Physician: Naomie Dean, MD  Referring Physician: Venita Lick, MD Height: 5 feet 7 inch    History: Numbness and tingling in the hands, emg/ncs bilateral upper extremities.  Summary: EMG/NCS performed on the bilateral upper extremities. Summary:   Nerve Conduction Studies were performed on the bilateral upper extremities.  The right median APB motor nerve showed prolonged distal onset latency (6.9 ms, N<4.4). The left  median APB motor nerve showed prolonged distal onset latency (6.6 ms, N<4.4). The right Median 2nd Digit orthodromic sensory nerve showed no response. The left  Median 2nd Digit orthodromic sensory nerve showed no response.   The right median/ulnar (palm) comparison nerve showed prolonged distal peak latency (Median Palm, 3.0 ms, N<2.2) and abnormal peak latency difference (Median Palm-Ulnar Palm, 1.1 ms, N<0.4) with a relative median delay.    The left median/ulnar (palm) comparison nerve showed prolonged distal peak latency (Median Palm, 2.7 ms, N<2.2) and abnormal peak latency difference (Median Palm-Ulnar Palm, 0.7 ms, N<0.4) with a relative median delay.    Conclusion: This is an abnormal study. There is electrophysiologic evidence of bilateral moderately-severe  right > left Carpal Tunnel Syndrome.  No suggestion of polyneuropathy or radiculopathy.   Naomie Dean M.D.  [facility] [address] [place], [place] [address] Tel: [phone] Fax: [phone]         [facility]    Nerve / Sites Muscle Latency Ref. Amplitude Ref. Rel Amp Segments Distance Velocity Ref. Area    ms ms mV mV %  cm m/s m/s mVms  L Median - APB     Wrist APB 6.6 ?4.4 6.1 ?4.0 100 Wrist - APB 7   27.8     Upper arm APB 9.9  7.2  118 Upper arm - Wrist 23 71 ?49 35.0     Ulnar Wrist APB 3.2  5.6  77.7 Ulnar Wrist - Upper arm     22.2     Ulnar B. Elbow APB 7.2  4.4  78.3 Ulnar B. Elbow - Ulnar Wrist    14.9     Ulnar A. Below APB 8.6  4.3  97.4 Ulnar A. Below - Ulnar B. Elbow    15.8  R Median - APB     Wrist APB 6.9 ?4.4 6.5 ?4.0 100 Wrist - APB 7   23.2     Upper arm APB 11.5  6.2  95.7 Upper arm - Wrist 23 49 ?49 21.6  L Ulnar - ADM     Wrist ADM 3.1 ?3.3 12.2 ?6.0 100 Wrist - ADM 7   36.2     B.Elbow ADM 7.0  10.9  89.5 B.Elbow - Wrist 21 54 ?49 36.6     A.Elbow ADM 8.9  9.7  89.1 A.Elbow - B.Elbow 10 52 ?49 33.6         A.Elbow - Wrist      R Ulnar - ADM     Wrist ADM 2.8 ?3.3 9.7 ?6.0 100 Wrist - ADM 7   30.9     B.Elbow ADM 6.4  7.9  81.5 B.Elbow - Wrist 21 59 ?49 27.0     A.Elbow ADM 8.2  7.7  97.1 A.Elbow - B.Elbow 10 56 ?49 26.5         A.Elbow - Wrist  Foley    Nerve / Sites Rec. Site Peak Lat Ref.  Amp Ref. Segments Distance Peak Diff Ref.    ms ms V V  cm ms ms  L Median, Ulnar - Transcarpal comparison     Median Palm Wrist 2.7 ?2.2 11 ?35 Median Palm - Wrist 8       Ulnar Palm Wrist 2.0 ?2.2 35 ?12 Ulnar Palm - Wrist 8          Median Palm - Ulnar Palm  0.7 ?0.4  R Median, Ulnar - Transcarpal comparison     Median Palm Wrist 3.0 ?2.2 12 ?35 Median Palm - Wrist 8       Ulnar Palm Wrist 1.8 ?2.2 25 ?12 Ulnar Palm - Wrist 8          Median Palm - Ulnar Palm  1.1 ?0.4  L Median - Orthodromic (Dig II, Mid palm)     Dig II Wrist NR ?3.4 NR ?10 Dig II - Wrist 13    R Median - Orthodromic (Dig II, Mid palm)     Dig II Wrist NR ?3.4 NR ?10 Dig II - Wrist 13    L Ulnar - Orthodromic, (Dig V, Mid palm)     Dig V Wrist 2.9 ?3.1 11 ?5 Dig V - Wrist 11    R Ulnar - Orthodromic, (Dig V, Mid palm)     Dig V Wrist 2.6 ?3.1 8 ?5 Dig V - Wrist 62                   F  Wave    Nerve F Lat Ref.   ms ms  L Ulnar - ADM 28.3 ?32.0  R Ulnar - ADM 27.7 ?32.0         EMG Summary Table    Spontaneous MUAP Recruitment  Muscle IA Fib PSW Fasc Other Amp Dur. Poly Pattern  L. Deltoid Normal None  None None _______ Normal Normal Normal Normal  R. Deltoid Normal None None None _______ Normal Normal Normal Normal  L. Triceps brachii Normal None None None _______ Normal Normal Normal Normal  R. Triceps brachii Normal None None None _______ Normal Normal Normal Normal  L. Pronator teres Normal None None None _______ Normal Normal Normal Normal  R. Pronator teres Normal None None None _______ Normal Normal Normal Normal  L. Opponens pollicis Normal None None None _______ Normal Normal Normal Normal  R. Opponens pollicis Normal None None None _______ Normal Normal Normal Normal  L. First dorsal interosseous Normal None None None _______ Normal Normal Normal Normal  R. First dorsal interosseous Normal None None None _______ Normal Normal Normal Normal

## 2020-03-10 ENCOUNTER — Other Ambulatory Visit: Payer: Self-pay

## 2020-03-10 ENCOUNTER — Ambulatory Visit (INDEPENDENT_AMBULATORY_CARE_PROVIDER_SITE_OTHER): Payer: 59 | Admitting: Neurology

## 2020-03-10 ENCOUNTER — Ambulatory Visit: Payer: 59 | Admitting: Neurology

## 2020-03-10 DIAGNOSIS — G5603 Carpal tunnel syndrome, bilateral upper limbs: Secondary | ICD-10-CM | POA: Diagnosis not present

## 2020-03-10 DIAGNOSIS — Z0289 Encounter for other administrative examinations: Secondary | ICD-10-CM

## 2020-03-10 DIAGNOSIS — R202 Paresthesia of skin: Secondary | ICD-10-CM | POA: Insufficient documentation

## 2020-03-10 NOTE — Procedures (Signed)
Full Name: Ernest Avery Gender: Male MRN #: [id number] Date of Birth: 09/17/1976    Visit Date: 03/10/2020 14:15 Age: 44 Years Examining Physician: Sarina Ill, MD  Referring Physician: Sarina Ill, MD Height: 5 feet 7 inch    History: Numbness and tingling in the hands, emg/ncs bilateral upper extremities.  Summary: EMG/NCS performed on the bilateral upper extremities. Summary:   Nerve Conduction Studies were performed on the bilateral upper extremities.  The right median APB motor nerve showed prolonged distal onset latency (6.9 ms, N<4.4). The left  median APB motor nerve showed prolonged distal onset latency (6.6 ms, N<4.4). The right Median 2nd Digit orthodromic sensory nerve showed no response. The left  Median 2nd Digit orthodromic sensory nerve showed no response.   The right median/ulnar (palm) comparison nerve showed prolonged distal peak latency (Median Palm, 3.0 ms, N<2.2) and abnormal peak latency difference (Median Palm-Ulnar Palm, 1.1 ms, N<0.4) with a relative median delay.    The left median/ulnar (palm) comparison nerve showed prolonged distal peak latency (Median Palm, 2.7 ms, N<2.2) and abnormal peak latency difference (Median Palm-Ulnar Palm, 0.7 ms, N<0.4) with a relative median delay.    Conclusion: This is an abnormal study. There is electrophysiologic evidence of bilateral moderately-severe  right > left Carpal Tunnel Syndrome.  No suggestion of polyneuropathy or radiculopathy.   Sarina Ill M.D.  [facility] [place], [place] [address] Tel: [phone] Fax: [phone]         [facility]    Nerve / Sites Muscle Latency Ref. Amplitude Ref. Rel Amp Segments Distance Velocity Ref. Area    ms ms mV mV %  cm m/s m/s mVms  L Median - APB     Wrist APB 6.6 ?4.4 6.1 ?4.0 100 Wrist - APB 7   27.8     Upper arm APB 9.9  7.2  118 Upper arm - Wrist 23 71 ?49 35.0     Ulnar Wrist APB 3.2  5.6  77.7 Ulnar Wrist - Upper arm     22.2     Ulnar B. Elbow APB 7.2  4.4  78.3 Ulnar B. Elbow - Ulnar Wrist    14.9     Ulnar A. Below APB 8.6  4.3  97.4 Ulnar A. Below - Ulnar B. Elbow    15.8  R Median - APB     Wrist APB 6.9 ?4.4 6.5 ?4.0 100 Wrist - APB 7   23.2     Upper arm APB 11.5  6.2  95.7 Upper arm - Wrist 23 49 ?49 21.6  L Ulnar - ADM     Wrist ADM 3.1 ?3.3 12.2 ?6.0 100 Wrist - ADM 7   36.2     B.Elbow ADM 7.0  10.9  89.5 B.Elbow - Wrist 21 54 ?49 36.6     A.Elbow ADM 8.9  9.7  89.1 A.Elbow - B.Elbow 10 52 ?49 33.6         A.Elbow - Wrist      R Ulnar - ADM     Wrist ADM 2.8 ?3.3 9.7 ?6.0 100 Wrist - ADM 7   30.9     B.Elbow ADM 6.4  7.9  81.5 B.Elbow - Wrist 21 59 ?49 27.0     A.Elbow ADM 8.2  7.7  97.1 A.Elbow - B.Elbow 10 56 ?49 26.5         A.Elbow - Wrist  SNC    Nerve / Sites Rec. Site Peak Lat Ref.  Amp Ref. Segments Distance Peak Diff Ref.    ms ms V V  cm ms ms  L Median, Ulnar - Transcarpal comparison     Median Palm Wrist 2.7 ?2.2 11 ?35 Median Palm - Wrist 8       Ulnar Palm Wrist 2.0 ?2.2 35 ?12 Ulnar Palm - Wrist 8          Median Palm - Ulnar Palm  0.7 ?0.4  R Median, Ulnar - Transcarpal comparison     Median Palm Wrist 3.0 ?2.2 12 ?35 Median Palm - Wrist 8       Ulnar Palm Wrist 1.8 ?2.2 25 ?12 Ulnar Palm - Wrist 8          Median Palm - Ulnar Palm  1.1 ?0.4  L Median - Orthodromic (Dig II, Mid palm)     Dig II Wrist NR ?3.4 NR ?10 Dig II - Wrist 13    R Median - Orthodromic (Dig II, Mid palm)     Dig II Wrist NR ?3.4 NR ?10 Dig II - Wrist 13    L Ulnar - Orthodromic, (Dig V, Mid palm)     Dig V Wrist 2.9 ?3.1 11 ?5 Dig V - Wrist 11    R Ulnar - Orthodromic, (Dig V, Mid palm)     Dig V Wrist 2.6 ?3.1 8 ?5 Dig V - Wrist 28                   F  Wave    Nerve F Lat Ref.   ms ms  L Ulnar - ADM 28.3 ?32.0  R Ulnar - ADM 27.7 ?32.0         EMG Summary Table    Spontaneous MUAP Recruitment  Muscle IA Fib PSW Fasc Other Amp Dur. Poly Pattern  L. Deltoid Normal None  None None _______ Normal Normal Normal Normal  R. Deltoid Normal None None None _______ Normal Normal Normal Normal  L. Triceps brachii Normal None None None _______ Normal Normal Normal Normal  R. Triceps brachii Normal None None None _______ Normal Normal Normal Normal  L. Pronator teres Normal None None None _______ Normal Normal Normal Normal  R. Pronator teres Normal None None None _______ Normal Normal Normal Normal  L. Opponens pollicis Normal None None None _______ Normal Normal Normal Normal  R. Opponens pollicis Normal None None None _______ Normal Normal Normal Normal  L. First dorsal interosseous Normal None None None _______ Normal Normal Normal Normal  R. First dorsal interosseous Normal None None None _______ Normal Normal Normal Normal

## 2020-03-10 NOTE — Progress Notes (Signed)
See procedure note.

## 2024-06-08 ENCOUNTER — Encounter: Payer: Self-pay | Admitting: Advanced Practice Midwife
